# Patient Record
Sex: Male | Born: 1955
Health system: Southern US, Community
[De-identification: ages and names within clinical notes are randomized; demographics above are authoritative.]

## PROBLEM LIST (undated history)

## (undated) DIAGNOSIS — E785 Hyperlipidemia, unspecified: Secondary | ICD-10-CM

## (undated) DIAGNOSIS — S82899A Other fracture of unspecified lower leg, initial encounter for closed fracture: Secondary | ICD-10-CM

## (undated) DIAGNOSIS — S0291XA Unspecified fracture of skull, initial encounter for closed fracture: Secondary | ICD-10-CM

## (undated) HISTORY — PX: SHOULDER SURGERY: SHX246

---

## 2000-05-06 ENCOUNTER — Ambulatory Visit (HOSPITAL_COMMUNITY): Admission: RE | Admit: 2000-05-06 | Discharge: 2000-05-06 | Payer: Self-pay | Admitting: Orthopedic Surgery

## 2000-05-06 ENCOUNTER — Encounter: Payer: Self-pay | Admitting: Orthopedic Surgery

## 2000-08-25 ENCOUNTER — Ambulatory Visit (HOSPITAL_COMMUNITY): Admission: RE | Admit: 2000-08-25 | Discharge: 2000-08-25 | Payer: Self-pay | Admitting: Orthopedic Surgery

## 2002-04-25 ENCOUNTER — Emergency Department (HOSPITAL_COMMUNITY): Admission: EM | Admit: 2002-04-25 | Discharge: 2002-04-25 | Payer: Self-pay | Admitting: *Deleted

## 2012-03-19 ENCOUNTER — Ambulatory Visit
Admission: RE | Admit: 2012-03-19 | Discharge: 2012-03-19 | Disposition: A | Discharge status: Home / Self Care | Payer: 59 | Source: Ambulatory Visit | Attending: Family Medicine | Admitting: Family Medicine

## 2012-03-19 ENCOUNTER — Other Ambulatory Visit: Payer: Self-pay | Admitting: Family Medicine

## 2012-03-19 DIAGNOSIS — M79669 Pain in unspecified lower leg: Secondary | ICD-10-CM

## 2012-06-11 ENCOUNTER — Ambulatory Visit: Payer: 59 | Attending: Family Medicine | Admitting: Physical Therapy

## 2012-06-11 DIAGNOSIS — M2569 Stiffness of other specified joint, not elsewhere classified: Secondary | ICD-10-CM | POA: Insufficient documentation

## 2012-06-11 DIAGNOSIS — M542 Cervicalgia: Secondary | ICD-10-CM | POA: Insufficient documentation

## 2012-06-11 DIAGNOSIS — IMO0001 Reserved for inherently not codable concepts without codable children: Secondary | ICD-10-CM | POA: Insufficient documentation

## 2012-06-19 ENCOUNTER — Ambulatory Visit: Payer: 59 | Admitting: Physical Therapy

## 2012-06-22 ENCOUNTER — Ambulatory Visit: Payer: 59 | Admitting: Physical Therapy

## 2012-06-25 ENCOUNTER — Ambulatory Visit: Payer: 59 | Attending: Family Medicine | Admitting: Physical Therapy

## 2012-06-25 DIAGNOSIS — M542 Cervicalgia: Secondary | ICD-10-CM | POA: Insufficient documentation

## 2012-06-25 DIAGNOSIS — IMO0001 Reserved for inherently not codable concepts without codable children: Secondary | ICD-10-CM | POA: Insufficient documentation

## 2012-06-25 DIAGNOSIS — M2569 Stiffness of other specified joint, not elsewhere classified: Secondary | ICD-10-CM | POA: Insufficient documentation

## 2012-06-29 ENCOUNTER — Ambulatory Visit: Payer: 59 | Admitting: Physical Therapy

## 2012-07-02 ENCOUNTER — Ambulatory Visit: Payer: 59 | Admitting: Physical Therapy

## 2012-07-06 ENCOUNTER — Encounter: Payer: 59 | Admitting: Physical Therapy

## 2012-07-06 ENCOUNTER — Ambulatory Visit: Payer: 59 | Admitting: Physical Therapy

## 2012-07-09 ENCOUNTER — Ambulatory Visit: Payer: 59 | Admitting: Physical Therapy

## 2012-07-13 ENCOUNTER — Ambulatory Visit: Payer: 59

## 2012-07-16 ENCOUNTER — Ambulatory Visit: Payer: 59 | Admitting: Physical Therapy

## 2014-10-19 ENCOUNTER — Encounter (INDEPENDENT_AMBULATORY_CARE_PROVIDER_SITE_OTHER): Payer: 59 | Admitting: Ophthalmology

## 2014-10-19 DIAGNOSIS — H3531 Nonexudative age-related macular degeneration: Secondary | ICD-10-CM

## 2014-10-19 DIAGNOSIS — H43813 Vitreous degeneration, bilateral: Secondary | ICD-10-CM

## 2014-10-19 DIAGNOSIS — H33012 Retinal detachment with single break, left eye: Secondary | ICD-10-CM

## 2014-10-26 ENCOUNTER — Ambulatory Visit (INDEPENDENT_AMBULATORY_CARE_PROVIDER_SITE_OTHER): Payer: 59 | Admitting: Ophthalmology

## 2014-10-28 ENCOUNTER — Ambulatory Visit (INDEPENDENT_AMBULATORY_CARE_PROVIDER_SITE_OTHER): Payer: 59 | Admitting: Ophthalmology

## 2014-10-28 DIAGNOSIS — H33302 Unspecified retinal break, left eye: Secondary | ICD-10-CM

## 2015-02-27 ENCOUNTER — Ambulatory Visit (INDEPENDENT_AMBULATORY_CARE_PROVIDER_SITE_OTHER): Payer: 59 | Admitting: Ophthalmology

## 2015-02-27 DIAGNOSIS — H33302 Unspecified retinal break, left eye: Secondary | ICD-10-CM | POA: Diagnosis not present

## 2015-02-27 DIAGNOSIS — H43813 Vitreous degeneration, bilateral: Secondary | ICD-10-CM | POA: Diagnosis not present

## 2015-02-27 DIAGNOSIS — H3531 Nonexudative age-related macular degeneration: Secondary | ICD-10-CM

## 2015-02-27 DIAGNOSIS — H2513 Age-related nuclear cataract, bilateral: Secondary | ICD-10-CM

## 2015-05-11 ENCOUNTER — Other Ambulatory Visit: Payer: Self-pay | Admitting: Sports Medicine

## 2015-05-11 DIAGNOSIS — M545 Low back pain: Secondary | ICD-10-CM

## 2015-05-14 ENCOUNTER — Ambulatory Visit
Admission: RE | Admit: 2015-05-14 | Discharge: 2015-05-14 | Disposition: A | Discharge status: Home / Self Care | Payer: Self-pay | Source: Ambulatory Visit | Attending: Sports Medicine | Admitting: Sports Medicine

## 2015-05-14 DIAGNOSIS — M545 Low back pain: Secondary | ICD-10-CM

## 2016-09-26 DIAGNOSIS — J069 Acute upper respiratory infection, unspecified: Secondary | ICD-10-CM | POA: Diagnosis not present

## 2016-11-19 DIAGNOSIS — E785 Hyperlipidemia, unspecified: Secondary | ICD-10-CM | POA: Diagnosis not present

## 2016-11-21 DIAGNOSIS — E291 Testicular hypofunction: Secondary | ICD-10-CM | POA: Diagnosis not present

## 2017-01-07 DIAGNOSIS — H353130 Nonexudative age-related macular degeneration, bilateral, stage unspecified: Secondary | ICD-10-CM | POA: Diagnosis not present

## 2017-06-02 DIAGNOSIS — Z23 Encounter for immunization: Secondary | ICD-10-CM | POA: Diagnosis not present

## 2017-06-12 DIAGNOSIS — D225 Melanocytic nevi of trunk: Secondary | ICD-10-CM | POA: Diagnosis not present

## 2017-06-12 DIAGNOSIS — L570 Actinic keratosis: Secondary | ICD-10-CM | POA: Diagnosis not present

## 2017-06-12 DIAGNOSIS — L821 Other seborrheic keratosis: Secondary | ICD-10-CM | POA: Diagnosis not present

## 2017-06-12 DIAGNOSIS — L814 Other melanin hyperpigmentation: Secondary | ICD-10-CM | POA: Diagnosis not present

## 2017-07-04 DIAGNOSIS — Z0000 Encounter for general adult medical examination without abnormal findings: Secondary | ICD-10-CM | POA: Diagnosis not present

## 2017-07-15 DIAGNOSIS — Z0000 Encounter for general adult medical examination without abnormal findings: Secondary | ICD-10-CM | POA: Diagnosis not present

## 2017-07-15 DIAGNOSIS — E785 Hyperlipidemia, unspecified: Secondary | ICD-10-CM | POA: Diagnosis not present

## 2017-08-05 DIAGNOSIS — J069 Acute upper respiratory infection, unspecified: Secondary | ICD-10-CM | POA: Diagnosis not present

## 2017-10-08 DIAGNOSIS — H5213 Myopia, bilateral: Secondary | ICD-10-CM | POA: Diagnosis not present

## 2018-01-05 DIAGNOSIS — D225 Melanocytic nevi of trunk: Secondary | ICD-10-CM | POA: Diagnosis not present

## 2018-01-05 DIAGNOSIS — L718 Other rosacea: Secondary | ICD-10-CM | POA: Diagnosis not present

## 2018-01-05 DIAGNOSIS — L814 Other melanin hyperpigmentation: Secondary | ICD-10-CM | POA: Diagnosis not present

## 2018-01-05 DIAGNOSIS — L820 Inflamed seborrheic keratosis: Secondary | ICD-10-CM | POA: Diagnosis not present

## 2018-01-05 DIAGNOSIS — L570 Actinic keratosis: Secondary | ICD-10-CM | POA: Diagnosis not present

## 2018-06-16 DIAGNOSIS — M25562 Pain in left knee: Secondary | ICD-10-CM | POA: Diagnosis not present

## 2018-06-18 DIAGNOSIS — M25562 Pain in left knee: Secondary | ICD-10-CM | POA: Diagnosis not present

## 2018-06-24 DIAGNOSIS — M25562 Pain in left knee: Secondary | ICD-10-CM | POA: Diagnosis not present

## 2018-06-25 DIAGNOSIS — M25562 Pain in left knee: Secondary | ICD-10-CM | POA: Diagnosis not present

## 2018-06-29 DIAGNOSIS — M25562 Pain in left knee: Secondary | ICD-10-CM | POA: Diagnosis not present

## 2018-06-29 DIAGNOSIS — S83412D Sprain of medial collateral ligament of left knee, subsequent encounter: Secondary | ICD-10-CM | POA: Diagnosis not present

## 2018-06-29 DIAGNOSIS — M6281 Muscle weakness (generalized): Secondary | ICD-10-CM | POA: Diagnosis not present

## 2018-07-06 DIAGNOSIS — M6281 Muscle weakness (generalized): Secondary | ICD-10-CM | POA: Diagnosis not present

## 2018-07-06 DIAGNOSIS — M25562 Pain in left knee: Secondary | ICD-10-CM | POA: Diagnosis not present

## 2018-07-06 DIAGNOSIS — S83412D Sprain of medial collateral ligament of left knee, subsequent encounter: Secondary | ICD-10-CM | POA: Diagnosis not present

## 2018-07-07 DIAGNOSIS — E785 Hyperlipidemia, unspecified: Secondary | ICD-10-CM | POA: Diagnosis not present

## 2018-07-07 DIAGNOSIS — Z125 Encounter for screening for malignant neoplasm of prostate: Secondary | ICD-10-CM | POA: Diagnosis not present

## 2018-07-07 DIAGNOSIS — Z0000 Encounter for general adult medical examination without abnormal findings: Secondary | ICD-10-CM | POA: Diagnosis not present

## 2018-07-13 DIAGNOSIS — M25562 Pain in left knee: Secondary | ICD-10-CM | POA: Diagnosis not present

## 2018-07-13 DIAGNOSIS — S83412D Sprain of medial collateral ligament of left knee, subsequent encounter: Secondary | ICD-10-CM | POA: Diagnosis not present

## 2018-07-13 DIAGNOSIS — M6281 Muscle weakness (generalized): Secondary | ICD-10-CM | POA: Diagnosis not present

## 2018-07-23 DIAGNOSIS — M25562 Pain in left knee: Secondary | ICD-10-CM | POA: Diagnosis not present

## 2018-07-28 ENCOUNTER — Ambulatory Visit (INDEPENDENT_AMBULATORY_CARE_PROVIDER_SITE_OTHER): Payer: 59 | Admitting: Psychiatry

## 2018-07-28 DIAGNOSIS — F411 Generalized anxiety disorder: Secondary | ICD-10-CM | POA: Diagnosis not present

## 2018-07-28 NOTE — Progress Notes (Signed)
      [facility] Counselor/Therapist Progress Note   Patient IDLavontay Paul, MRN: [id number]  Date: 07/28/2018  Timespent: 52 minutes   Treatment Type: Individual   Reported Symptoms: anxiety   Mental Status Exam:    Appearance:   Casual     Behavior:  Appropriate  Motor:  Normal  Speech/Language:   Clear and Coherent  Affect:  Appropriate  Mood:  anxious  Thought process:  normal  Thought content:    WNL  Sensory/Perceptual disturbances:    WNL  Orientation:  oriented to person, place, time/date and situation  Attention:  Good  Concentration:  Good  Memory:  WNL  Fund of knowledge:   Good  Insight:    Good  Judgment:   Good  Impulse Control:  Good     Risk Assessment: Danger to Self:  No Self-injurious Behavior: No Danger to Others: No Duty to Warn:no Physical Aggression / Violence:No  Access to Firearms a concern: No  Gang Involvement:No    Subjective: The client reports that he and his girlfriend have been meeting with a marriage counselor,[place], [place].  He realizes that their main issues has to do with her son.  She does not trust him because he asks his prayer group for prayers about her son.  She is very private and it upsets her that others Shawan Corella know her business.  She is very clear with him that she will not marry him or cohabitate with him.  This puzzles the client because he feels they have a very dynamic relationship.  The girlfriend is very focused on her relationship with her son who is 5 years old and in his last year of college. The clients own children are doing well.  He has started moving towards some of the goals he had set at last session.  He is working on his resume but he has yet to contact a Field seismologist.  His current job seems to be stable but that could change at any moment.  The client continues to exercise and maintain his friendships.  He notes his overall anxiety is a little bit less.  On a subjective units of distress scale it is at  a 4.  Interventions: Assertiveness/Communication, Solution-Oriented/Positive Psychology and Insight-Oriented   Diagnosis:   ICD-10-CM   1. Generalized anxiety disorder F41.1      Plan: Boundaries, assertiveness, resume.   Andrew Paul Andrew Paul, [place]

## 2018-08-28 ENCOUNTER — Ambulatory Visit (INDEPENDENT_AMBULATORY_CARE_PROVIDER_SITE_OTHER): Payer: 59 | Admitting: Psychiatry

## 2018-08-28 ENCOUNTER — Encounter: Payer: Self-pay | Admitting: Psychiatry

## 2018-08-28 DIAGNOSIS — F411 Generalized anxiety disorder: Secondary | ICD-10-CM | POA: Diagnosis not present

## 2018-08-28 NOTE — Progress Notes (Signed)
      [facility] Counselor/Therapist Progress Note  Patient IDMina Paul, MRN: [id number],    Date: 08/28/2018   Time Spent: 45 minutes   Treatment Type: Individual Therapy  Reported Symptoms: Anxious Mood  Mental Status Exam:  Appearance:   Well Groomed     Behavior:  Appropriate  Motor:  Normal  Speech/Language:   Clear and Coherent  Affect:  Appropriate  Mood:  anxious  Thought process:  normal  Thought content:    WNL  Sensory/Perceptual disturbances:    WNL  Orientation:  oriented to person, place, time/date and situation  Attention:  Good  Concentration:  Good  Memory:  WNL  Fund of knowledge:   Good  Insight:    Good  Judgment:   Good  Impulse Control:  Good   Risk Assessment: Danger to Self:  No Self-injurious Behavior: No Danger to Others: No Duty to Warn:no Physical Aggression / Violence:No  Access to Firearms a concern: No  Gang Involvement:No   Subjective: The client reports that he is just returned from a national meeting.  The higher ups in the company had complained because sales this past year had dropped.  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  Sales dropped dramatically.  He was then told because that his compensation package had over paid him $7000.  They expected him to pay that back.  This flabbergasted the client.  He talked with his direct supervisor who was open to listening to him.  His supervisor then went to the president of the company to get his job re-leveled and a reprieve from the $7000.  His supervisor came back and told him that his job would become a salary position and that he was not getting find the $7000.  The client was ultimately relieved because he was not getting fired.  The client is 62 years old and hopes to make it to 29 so he can retire.  The client was very anxious as he came in more charged up on adrenaline because of what had  just happened.  We discussed the fact that the client still needs to get his resume together because of the instability of his current job.  He agreed.  His knee has recovered from an injury and he is back to playing soccer.  We discussed the fact that he needed to increase his exercise to help reduce his overall stress level.  He agreed.  The client has also been able to do more positive self talk.  He will continue to do so.  Interventions: Assertiveness/Communication, Solution-Oriented/Positive Psychology and Insight-Oriented  Diagnosis:   ICD-10-CM   1. Generalized anxiety disorder F41.1     Plan: Exercise, resume, positive self talk.  Albertina Parr [place], [place]

## 2018-09-02 DIAGNOSIS — Z23 Encounter for immunization: Secondary | ICD-10-CM | POA: Diagnosis not present

## 2018-09-10 DIAGNOSIS — M25562 Pain in left knee: Secondary | ICD-10-CM | POA: Diagnosis not present

## 2018-09-25 ENCOUNTER — Other Ambulatory Visit: Payer: Self-pay

## 2018-09-25 ENCOUNTER — Encounter (HOSPITAL_COMMUNITY): Payer: Self-pay | Admitting: Emergency Medicine

## 2018-09-25 ENCOUNTER — Emergency Department (HOSPITAL_COMMUNITY): Payer: 59

## 2018-09-25 ENCOUNTER — Ambulatory Visit (HOSPITAL_COMMUNITY)
Admission: EM | Admit: 2018-09-25 | Discharge: 2018-09-25 | Disposition: A | Discharge status: Home / Self Care | Payer: 59

## 2018-09-25 ENCOUNTER — Emergency Department (HOSPITAL_COMMUNITY)
Admission: EM | Admit: 2018-09-25 | Discharge: 2018-09-25 | Disposition: A | Discharge status: Home / Self Care | Payer: 59 | Attending: Emergency Medicine | Admitting: Emergency Medicine

## 2018-09-25 DIAGNOSIS — Z5321 Procedure and treatment not carried out due to patient leaving prior to being seen by health care provider: Secondary | ICD-10-CM | POA: Insufficient documentation

## 2018-09-25 DIAGNOSIS — R079 Chest pain, unspecified: Secondary | ICD-10-CM

## 2018-09-25 HISTORY — DX: Hyperlipidemia, unspecified: E78.5

## 2018-09-25 HISTORY — DX: Other fracture of unspecified lower leg, initial encounter for closed fracture: S82.899A

## 2018-09-25 HISTORY — DX: Unspecified fracture of skull, initial encounter for closed fracture: S02.91XA

## 2018-09-25 LAB — BASIC METABOLIC PANEL
Anion gap: 7 (ref 5–15)
BUN: 14 mg/dL (ref 8–23)
CO2: 23 mmol/L (ref 22–32)
CREATININE: 1.06 mg/dL (ref 0.61–1.24)
Calcium: 8.9 mg/dL (ref 8.9–10.3)
Chloride: 108 mmol/L (ref 98–111)
GFR calc Af Amer: >60 mL/min (ref >60)
GFR calc non Af Amer: >60 mL/min (ref >60)
Glucose, Bld: 104 mg/dL — ABNORMAL HIGH (ref 70–99)
Potassium: 3.7 mmol/L (ref 3.5–5.1)
Sodium: 138 mmol/L (ref 135–145)

## 2018-09-25 LAB — I-STAT TROPONIN, ED: Troponin i, poc: 0.01 ng/mL (ref 0.00–0.08)

## 2018-09-25 LAB — CBC
HCT: 40.6 % (ref 39.0–52.0)
Hemoglobin: 14.7 g/dL (ref 13.0–17.0)
MCH: 31.7 pg (ref 26.0–34.0)
MCHC: 36.2 g/dL — ABNORMAL HIGH (ref 30.0–36.0)
MCV: 87.7 fL (ref 80.0–100.0)
Platelets: 193 10*3/uL (ref 150–400)
RBC: 4.63 MIL/uL (ref 4.22–5.81)
RDW: 12.5 % (ref 11.5–15.5)
WBC: 4.7 10*3/uL (ref 4.0–10.5)
nRBC: 0 % (ref 0.0–0.2)

## 2018-09-25 NOTE — ED Notes (Signed)
Pt here for off and on chest pain, ekg obtain, denies chest pain at this time. EKG given to Dr. Meda Coffee, pt needs eval in ER. Pt and wife agreeable to plan. Left for ER.

## 2018-09-25 NOTE — ED Triage Notes (Signed)
C/o intermittent pain (feels like "needles") to upper left chest since last night.  Reports a lot of stress at work.  Denies nausea, vomiting, and SOB.  No pain at present.

## 2018-09-25 NOTE — ED Notes (Signed)
Pt is wanting to leave. Pt encouraged to stay and see provider. Pt refuses and turns in labels. Pt seen walkign out of lobby with family.

## 2018-09-30 ENCOUNTER — Ambulatory Visit (INDEPENDENT_AMBULATORY_CARE_PROVIDER_SITE_OTHER): Payer: 59 | Admitting: Psychiatry

## 2018-09-30 ENCOUNTER — Encounter: Payer: Self-pay | Admitting: Psychiatry

## 2018-09-30 DIAGNOSIS — F411 Generalized anxiety disorder: Secondary | ICD-10-CM

## 2018-09-30 NOTE — Progress Notes (Signed)
      [facility] Counselor/Therapist Progress Note  Patient IDDale Paul, MRN: [id number],    Date: 09/30/2018  Time Spent: 30 minutes   Treatment Type: Individual Therapy  Reported Symptoms: Anxious Mood  Mental Status Exam:  Appearance:   Casual and Well Groomed     Behavior:  Appropriate  Motor:  Normal  Speech/Language:   Clear and Coherent  Affect:  Appropriate  Mood:  anxious  Thought process:  normal  Thought content:    WNL  Sensory/Perceptual disturbances:    WNL  Orientation:  oriented to person, place, time/date and situation  Attention:  Good  Concentration:  Good  Memory:  WNL  Fund of knowledge:   Good  Insight:    Good  Judgment:   Good  Impulse Control:  Good   Risk Assessment: Danger to Self:  No Self-injurious Behavior: No Danger to Others: No Duty to Warn:no Physical Aggression / Violence:No  Access to Firearms a concern: No  Gang Involvement:No   Subjective: The client was late for his appointment due to a delay at his job.  He states right before Christmas he and his girlfriend had followed up with their relationship counselor.  She told him in no uncertain terms and that session #1 she does not want to get married, #2 she does not want to live together.  The client was very hurt by this and they had a lapse of contact for 10 days.  She has come back stating that she knows she has issues.  The client is somewhat relieved with this but will continue to move forward cautiously.  He will be assertive and set appropriate boundaries.  Interventions: Solution-Oriented/Positive Psychology and Insight-Oriented  Diagnosis:   ICD-10-CM   1. Generalized anxiety disorder F41.1     Plan: Boundaries, assertiveness, self care.  [name], [place]

## 2018-10-21 ENCOUNTER — Encounter: Payer: Self-pay | Admitting: Psychiatry

## 2018-10-21 ENCOUNTER — Ambulatory Visit (INDEPENDENT_AMBULATORY_CARE_PROVIDER_SITE_OTHER): Payer: 59 | Admitting: Psychiatry

## 2018-10-21 DIAGNOSIS — F411 Generalized anxiety disorder: Secondary | ICD-10-CM

## 2018-10-21 NOTE — Progress Notes (Signed)
      [facility] Counselor/Therapist Progress Note  Patient IDJermal Paul, MRN: [id number],    Date: 10/21/2018  Time Spent: 48 minutes   Treatment Type: Individual Therapy  Reported Symptoms: Anxious Mood  Mental Status Exam:  Appearance:   Casual and Well Groomed     Behavior:  Appropriate  Motor:  Normal  Speech/Language:   Clear and Coherent  Affect:  Appropriate  Mood:  anxious  Thought process:  normal  Thought content:    WNL  Sensory/Perceptual disturbances:    WNL  Orientation:  oriented to person, place, time/date and situation  Attention:  Good  Concentration:  Good  Memory:  WNL  Fund of knowledge:   Good  Insight:    Good  Judgment:   Good  Impulse Control:  Good   Risk Assessment: Danger to Self:  No Self-injurious Behavior: No Danger to Others: No Duty to Warn:no Physical Aggression / Violence:No  Access to Firearms a concern: No  Gang Involvement:No   Subjective: The client states that he has been finishing projects at his home as we had discussed last time.  This includes painting, remodeling his bathroom and finishing his kitchen.  Today he is having his front door replaced which she is very excited about.  He also is working on getting the house thoroughly cleaned as this has been 1 of the big issues that his girlfriend has had with him. His job is still transitioning.  He has been really leveled in his position which is actually been a promotion.  He does note with the changes in the bony structure that he is going to have an $800 a month deficit in his cash flow.  This concerns him.  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  He has decided to throw his hat in the reading and applying for the director position.  It will be more of a promotion and paying more money. All of this generates uncertainty with the client.  Today we used E MDR to reduce the clients subjective units  of distress from a 5+ to less than 1 at the end of the session.  He sees that he just needs to continue to put 1 foot in front of the other.  Interventions: Assertiveness/Communication, Solution-Oriented/Positive Psychology, Eye Movement Desensitization and Reprocessing (EMDR) and Insight-Oriented  Diagnosis:   ICD-10-CM   1. Generalized anxiety disorder F41.1     Plan: Assertiveness, boundaries, self-care.  Albertina Parr Toriano [place], [place]

## 2018-11-24 ENCOUNTER — Encounter: Payer: Self-pay | Admitting: Psychiatry

## 2018-11-24 ENCOUNTER — Ambulatory Visit (INDEPENDENT_AMBULATORY_CARE_PROVIDER_SITE_OTHER): Payer: 59 | Admitting: Psychiatry

## 2018-11-24 DIAGNOSIS — F411 Generalized anxiety disorder: Secondary | ICD-10-CM

## 2018-11-24 NOTE — Progress Notes (Signed)
      [facility] Counselor/Therapist Progress Note  Patient IDLaurel Paul, MRN: [id number],    Date: 11/24/2018  Time Spent: 51 minutes   Treatment Type: Individual Therapy  Reported Symptoms: anxiety  Mental Status Exam:  Appearance:   Casual and Well Groomed     Behavior:  Appropriate  Motor:  Normal  Speech/Language:   Clear and Coherent  Affect:  Appropriate  Mood:  anxious  Thought process:  normal  Thought content:    WNL  Sensory/Perceptual disturbances:    WNL  Orientation:  oriented to person, place, time/date and situation  Attention:  Good  Concentration:  Good  Memory:  WNL  Fund of knowledge:   Good  Insight:    Good  Judgment:   Good  Impulse Control:  Good   Risk Assessment: Danger to Self:  No Self-injurious Behavior: No Danger to Others: No Duty to Warn:no Physical Aggression / Violence:No  Access to Firearms a concern: No  Gang Involvement:No   Subjective: The client reports that his girlfriend and son got a great job in [place], [place].  He will be moving there in June.  This is a good step forward for his relationship with his girlfriend.  Her concern about them getting married was that her son was not successfully launched.  With this job in [place] that problem is solved.  The client also reports that his projects at his house are going well.  He is completing the tasks that his girlfriend has laid out as necessary for them to be together long-term.  His current job has been restructured in a way that makes sense to him.  He has the opportunity at the end of the year based on sales to make a large bonus.  All of this has made the client much less anxious.  He continues to exercise and work on his self-care.  He also is evaluating his negative thoughts connected to his anxiety as they come up.  Interventions: Assertiveness/Communication, Solution-Oriented/Positive Psychology and Insight-Oriented  Diagnosis:   ICD-10-CM   1. Generalized anxiety  disorder F41.1     Plan: Exercise, self-care, positive self talk.  This record has been created using [employer].  Chart creation errors have been sought, but Andrew Paul not always have been located and corrected. Such creation errors do not reflect on the standard of medical care.   Andrew Paul, [place]

## 2018-12-30 ENCOUNTER — Ambulatory Visit: Payer: 59 | Admitting: Psychiatry

## 2019-01-25 ENCOUNTER — Ambulatory Visit: Payer: 59 | Admitting: Psychiatry

## 2019-03-02 ENCOUNTER — Ambulatory Visit: Payer: 59 | Admitting: Psychiatry

## 2019-09-25 IMAGING — CR DG CHEST 2V
2 series · 2 of 2 positions shown · non-contrast
Comparison: None.

CLINICAL DATA: Anxiety.  Anterior chest pain.

EXAM:
CHEST - 2 VIEW

[chest pa]
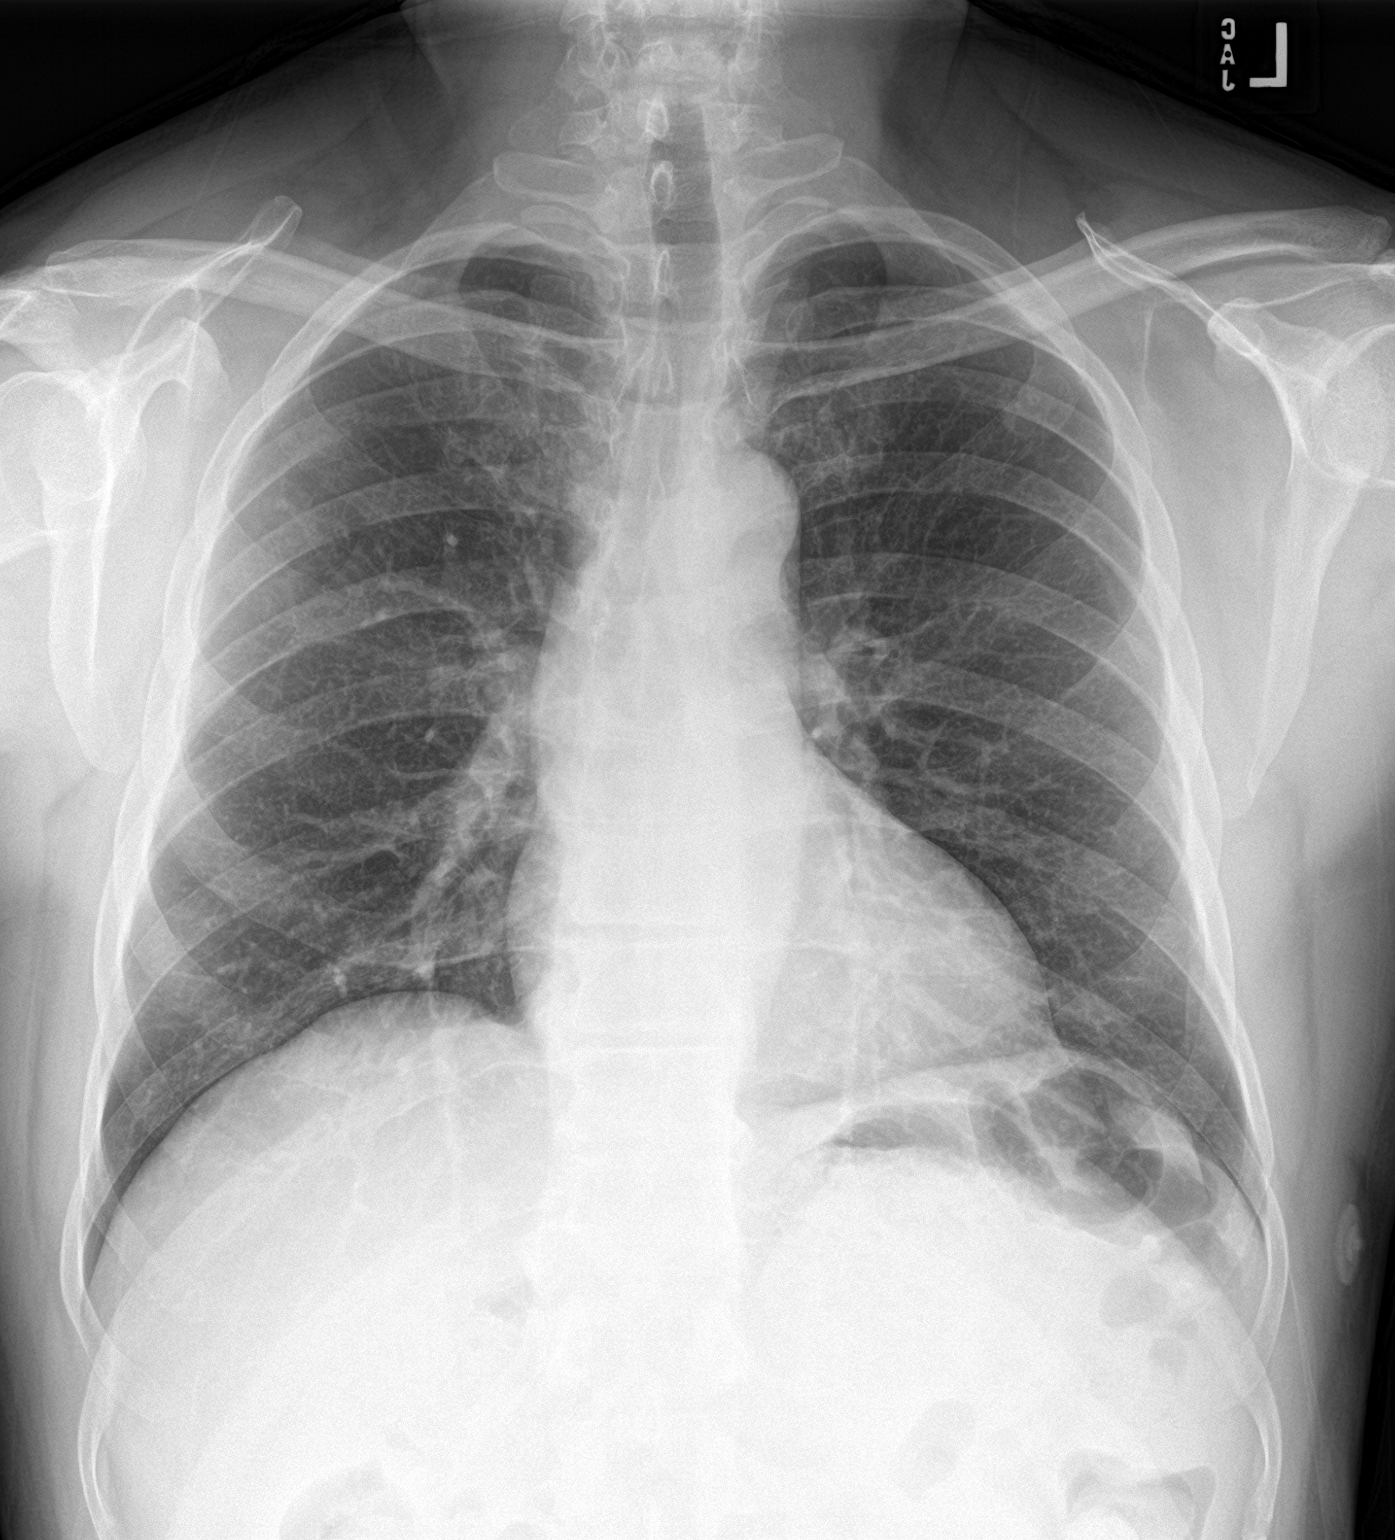

[chest lat]
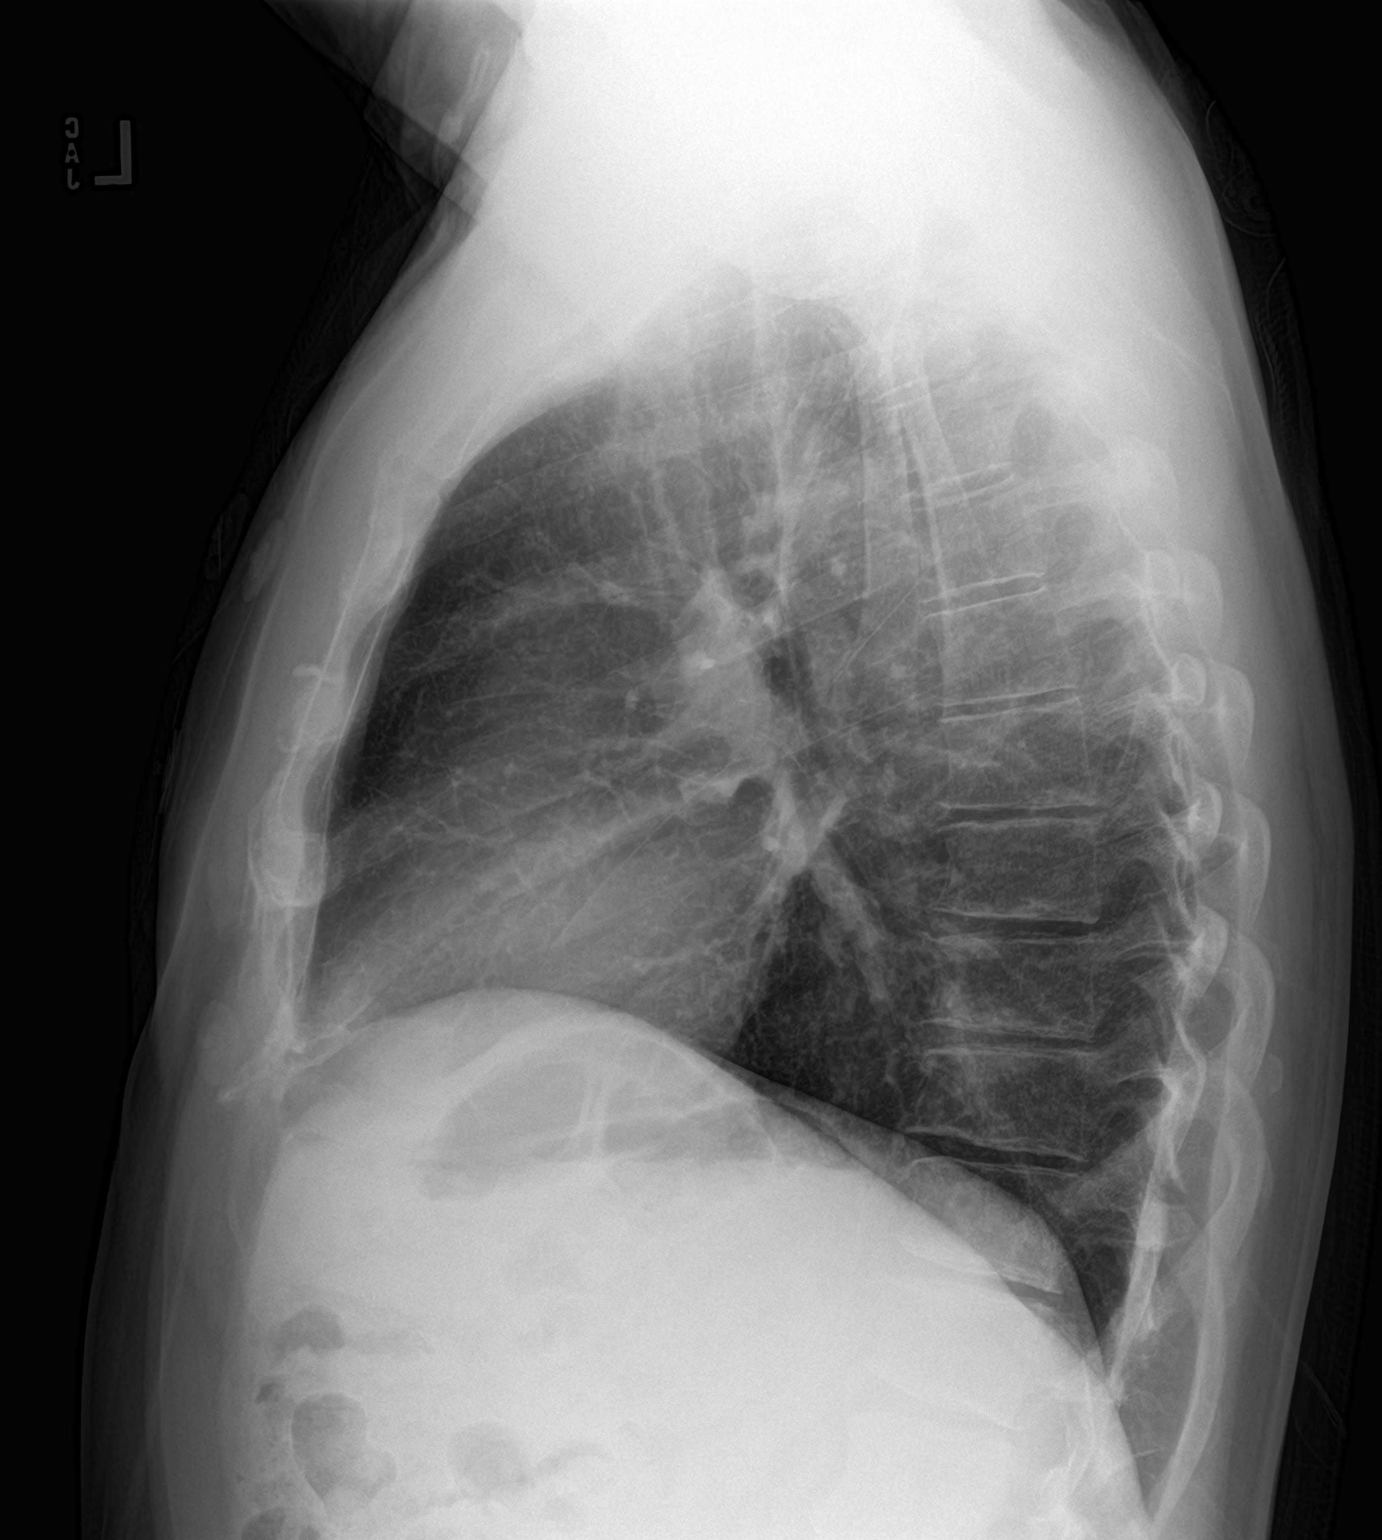

[2 of 2 positions shown; findings below may reference images not displayed]

FINDINGS: Cardiac silhouette is normal in size. Normal mediastinal and hilar
contours.

Clear lungs.  No pleural effusion or pneumothorax.

Skeletal structures are intact.
IMPRESSION: No active cardiopulmonary disease.

## 2020-08-09 ENCOUNTER — Ambulatory Visit (INDEPENDENT_AMBULATORY_CARE_PROVIDER_SITE_OTHER): Payer: 59 | Admitting: Psychiatry

## 2020-08-09 ENCOUNTER — Other Ambulatory Visit: Payer: Self-pay

## 2020-08-09 ENCOUNTER — Encounter: Payer: Self-pay | Admitting: Psychiatry

## 2020-08-09 DIAGNOSIS — F411 Generalized anxiety disorder: Secondary | ICD-10-CM | POA: Diagnosis not present

## 2020-08-09 NOTE — Progress Notes (Signed)
      [facility] Counselor/Therapist Progress Note  Patient IDAbishai Paul, MRN: [id number],    Date: 08/09/2020  Time Spent: 50 minutes   Treatment Type: Individual Therapy  Reported Symptoms: anxiety  Mental Status Exam:  Appearance:   Casual     Behavior:  Appropriate  Motor:  Normal  Speech/Language:   Clear and Coherent  Affect:  Appropriate  Mood:  anxious  Thought process:  normal  Thought content:    WNL  Sensory/Perceptual disturbances:    WNL  Orientation:  oriented to person, place, time/date and situation  Attention:  Good  Concentration:  Good  Memory:  WNL  Fund of knowledge:   Good  Insight:    Good  Judgment:   Good  Impulse Control:  Good   Risk Assessment: Danger to Self:  No Self-injurious Behavior: No Danger to Others: No Duty to Warn:no Physical Aggression / Violence:No  Access to Firearms a concern: No  Gang Involvement:No   Subjective: The client states that he recently broke up with his longtime girlfriend.  She lives in [place] and works as a Immunologist at [employer].  He has realized more and more that she has covert malignant narcissism.  The client states he has been watching YouTube videos on the subject.  He states it has been very instructive for him concerning the symptoms of narcissism.  In addition to being narcissistic she also has an obsessive-compulsive disorder.  He states she controls everything about the relationship.  He comes over 2 days a week but he cannot keep any of his stuff there.  Recently she decided they could no longer kiss because of the fear of COVID.  The client thought that was a little odd considering they continued to have sex.  As the client discussed this more and more his hope was that there could be a way he could communicate to her that she had these problems. I explained to the client that narcissists typically do not come into my office because they are not the problem.  It is usually everyone else.  They also  lack insight and empathy which are major problems in relationships.  The client kept trying to explain that if she could just understand she could change her behavior.  I told the client if he needed to write a letter to her he could but not send it.  To continue to try and engage her will just lead to more difficulties.  Ultimately the client decided that he needed to block her on all his media and let it go.  The client's first wife had a borderline personality disorder.  I suggested to the client that he be careful when he begins to date again and pay attention to potential red flags.  He agreed.  Interventions: Assertiveness/Communication, Motivational Interviewing, Solution-Oriented/Positive Psychology and Insight-Oriented  Diagnosis:   ICD-10-CM   1. Generalized anxiety disorder  F41.1     Plan: Mood independent behavior, positive self talk, self-care, block ex-girlfriend and all social media, write letter to girlfriend he does not send.  Shital Crayton, [facility]

## 2020-09-01 ENCOUNTER — Ambulatory Visit (INDEPENDENT_AMBULATORY_CARE_PROVIDER_SITE_OTHER): Payer: 59 | Admitting: Psychiatry

## 2020-09-01 ENCOUNTER — Other Ambulatory Visit: Payer: Self-pay

## 2020-09-01 ENCOUNTER — Encounter: Payer: Self-pay | Admitting: Psychiatry

## 2020-09-01 DIAGNOSIS — F411 Generalized anxiety disorder: Secondary | ICD-10-CM | POA: Diagnosis not present

## 2020-09-01 NOTE — Progress Notes (Signed)
      [facility] Counselor/Therapist Progress Note  Patient IDHarkirat Paul, MRN: [id number],    Date: 09/01/2020  Time Spent: 50 minutes   Treatment Type: Individual Therapy  Reported Symptoms: anxiety  Mental Status Exam:  Appearance:   Casual     Behavior:  Appropriate  Motor:  Normal  Speech/Language:   Clear and Coherent  Affect:  Appropriate  Mood:  anxious  Thought process:  normal  Thought content:    WNL  Sensory/Perceptual disturbances:    WNL  Orientation:  oriented to person, place, time/date and situation  Attention:  Good  Concentration:  Good  Memory:  WNL  Fund of knowledge:   Good  Insight:    Good  Judgment:   Good  Impulse Control:  Good   Risk Assessment: Danger to Self:  No Self-injurious Behavior: No Danger to Others: No Duty to Warn:no Physical Aggression / Violence:No  Access to Firearms a concern: No  Gang Involvement:No   Subjective: "I cut off communication with my ex-girlfriend.  She ended up contacting my son who set an appropriate boundary.  He then blocked her as well."  The client states that he has done better.  He has been trying to stay busy.  It is hardest when he is alone.  I suggested to the client that he write his ex-girlfriend a letter that he does not send as a way to help him to continue to process his thoughts and feelings.  He Marquette Piontek consider this. He stated that he met with his stepmother who has the power of attorney for her father in [place].  She told him out right she did not like him and did not want him to be involved in any of his father's properties.  She also knows that the client is friends with her son and expressly told him to discontinue that relationship.  I pointed out to the client that it appears his father married someone very narcissistic as well.  The client realized this was true.  He will not get into a power struggle with his stepmother and let things play out as they will. The client discussed future  relationships and how he wants to handle them.  I suggested to the client that he try to develop emotional intimacy first before defaulting to physical intimacy.  This gives a much better outcome since physical intimacy tends to tie hearts together.  If the relationship is not going to work it makes it easier to end it.  The client agreed and stated he would try to be intentional about this since it dovetails with his Darrick Meigs beliefs.   Interventions: Assertiveness/Communication, Motivational Interviewing, Solution-Oriented/Positive Psychology and Insight-Oriented  Diagnosis:   ICD-10-CM   1. Generalized anxiety disorder  F41.1     Plan: Mood independent behavior, positive self talk, self-care, boundaries, assertiveness, avoid power struggles, letter to girlfriend that the client does not send.  Valory Wetherby, [facility]

## 2020-09-12 ENCOUNTER — Encounter: Payer: Self-pay | Admitting: Psychiatry

## 2020-09-12 ENCOUNTER — Other Ambulatory Visit: Payer: Self-pay

## 2020-09-12 ENCOUNTER — Ambulatory Visit (INDEPENDENT_AMBULATORY_CARE_PROVIDER_SITE_OTHER): Payer: 59 | Admitting: Psychiatry

## 2020-09-12 DIAGNOSIS — F411 Generalized anxiety disorder: Secondary | ICD-10-CM

## 2020-09-12 NOTE — Progress Notes (Signed)
°      [facility] Counselor/Therapist Progress Note  Patient IDRachel Paul, MRN: [id number],    Date: 09/12/2020  Time Spent: 30 minutes   Treatment Type: Individual Therapy  Reported Symptoms: anxiety  Mental Status Exam:  Appearance:   Casual     Behavior:  Appropriate  Motor:  Normal  Speech/Language:   Clear and Coherent  Affect:  Appropriate  Mood:  anxious  Thought process:  normal  Thought content:    WNL  Sensory/Perceptual disturbances:    WNL  Orientation:  oriented to person, place, time/date and situation  Attention:  Good  Concentration:  Good  Memory:  WNL  Fund of knowledge:   Good  Insight:    Good  Judgment:   Good  Impulse Control:  Good   Risk Assessment: Danger to Self:  No Self-injurious Behavior: No Danger to Others: No Duty to Warn:no Physical Aggression / Violence:No  Access to Firearms a concern: No  Gang Involvement:No   Subjective: The client states he has had some ups and downs which has caused him anxiety.  He has been missing his ex-girlfriend.  "I have been on a few dates."  He found her profile on the same website he was using which bothered him.  He thinks about the pros and cons of his ex-girlfriend but when it finally comes down to it he feels the narcissistic traits make it a no go for him. The client has worked at managing his mood with exercise.  I discussed controlling his thought process today being mindful and using thought stopping.  He is also focusing on radical acceptance that the circumstances are as they are.  He is trying to live more intentionally.  Currently he will work on his social network.  He plans on attending some [place] groups especially those around hiking.  The client had to leave the session early to make a telephone conference.  Interventions: Assertiveness/Communication, Motivational Interviewing, Solution-Oriented/Positive Psychology and Insight-Oriented  Diagnosis:   ICD-10-CM   1. Generalized anxiety  disorder  F41.1     Plan: Independent behavior, exercise, intentionality, radical acceptance, assertiveness, boundaries, thought stopping.  Emilyanne Mcgough, [facility]

## 2020-10-24 ENCOUNTER — Ambulatory Visit: Payer: 59 | Admitting: Psychiatry

## 2020-11-14 ENCOUNTER — Ambulatory Visit: Payer: 59 | Admitting: Psychiatry

## 2020-12-05 ENCOUNTER — Other Ambulatory Visit: Payer: Self-pay

## 2020-12-05 ENCOUNTER — Ambulatory Visit (INDEPENDENT_AMBULATORY_CARE_PROVIDER_SITE_OTHER): Payer: 59 | Admitting: Psychiatry

## 2020-12-05 ENCOUNTER — Encounter: Payer: Self-pay | Admitting: Psychiatry

## 2020-12-05 DIAGNOSIS — F411 Generalized anxiety disorder: Secondary | ICD-10-CM

## 2020-12-05 NOTE — Progress Notes (Signed)
      [facility] Counselor/Therapist Progress Note  Patient IDEmrik Paul, MRN: [id number],    Date: 12/05/2020  Time Spent: 45 minutes   Treatment Type: Individual Therapy  Reported Symptoms: anxiety  Mental Status Exam:  Appearance:   Casual and Well Groomed     Behavior:  Appropriate  Motor:  Normal  Speech/Language:   Clear and Coherent  Affect:  Appropriate  Mood:  anxious  Thought process:  normal  Thought content:    WNL  Sensory/Perceptual disturbances:    WNL  Orientation:  oriented to person, place, time/date and situation  Attention:  Good  Concentration:  Good  Memory:  WNL  Fund of knowledge:   Good  Insight:    Good  Judgment:   Good  Impulse Control:  Good   Risk Assessment: Danger to Self:  No Self-injurious Behavior: No Danger to Others: No Duty to Warn:no Physical Aggression / Violence:No  Access to Firearms a concern: No  Gang Involvement:No   Subjective: The client states that he has not seen his ex-girlfriend since they broke up 4 months ago.  As the client reviewed his relationship with her he stated, "I do not think I could have done any better than I did."  As we discussed his relationship in detail the client noted that because of his girlfriend's narcissistic behavior she really was not equipped to be emotionally reciprocal.  I agreed that she just did not have the necessary skill set.  The client states that he continues to process it but he notes he is not as sad.  I explained to the client that it can take people up to 5 years to process the loss of a person or a relationship. The client states that he has done some dating but it has not gone well.  He has had some coffee dates but not really connected to anyone.  We discussed exploring the [place] groups where people are doing activities.  It would be a better circumstance to meet someone that has a similar interest.  The client agreed.  I also suggested that he look into playing pickle ball  which seems to be very popular right now.  We also discussed the new church the client is attending.  Looking into joining one of the Sunday school groups might be a way of the meeting other single women.  The client agreed.  Overall he feels he is doing fairly well except for the occasional anxiety.  Interventions: Assertiveness/Communication, Motivational Interviewing, Solution-Oriented/Positive Psychology, [employer] Desensitization and Reprocessing (EMDR) and Insight-Oriented  Diagnosis:   ICD-10-CM   1. Generalized anxiety disorder  F41.1     Plan: Positive self talk, self-care, explore other activity groups such as meet up, joining a pickleball group, continue with church, exercise, assertiveness, boundaries.  Genesi Stefanko, [facility]

## 2020-12-26 ENCOUNTER — Ambulatory Visit: Payer: 59 | Admitting: Psychiatry

## 2021-01-16 ENCOUNTER — Ambulatory Visit: Payer: 59 | Admitting: Psychiatry

## 2021-02-13 ENCOUNTER — Ambulatory Visit: Payer: 59 | Admitting: Psychiatry

## 2021-11-19 DIAGNOSIS — E7800 Pure hypercholesterolemia, unspecified: Secondary | ICD-10-CM | POA: Diagnosis not present

## 2021-11-19 DIAGNOSIS — Z0000 Encounter for general adult medical examination without abnormal findings: Secondary | ICD-10-CM | POA: Diagnosis not present

## 2021-11-19 DIAGNOSIS — Z1211 Encounter for screening for malignant neoplasm of colon: Secondary | ICD-10-CM | POA: Diagnosis not present

## 2021-12-22 DIAGNOSIS — Z7184 Encounter for health counseling related to travel: Secondary | ICD-10-CM | POA: Diagnosis not present

## 2021-12-26 DIAGNOSIS — D123 Benign neoplasm of transverse colon: Secondary | ICD-10-CM | POA: Diagnosis not present

## 2021-12-26 DIAGNOSIS — Z8371 Family history of colonic polyps: Secondary | ICD-10-CM | POA: Diagnosis not present

## 2021-12-26 DIAGNOSIS — D122 Benign neoplasm of ascending colon: Secondary | ICD-10-CM | POA: Diagnosis not present

## 2021-12-26 DIAGNOSIS — Z86010 Personal history of colonic polyps: Secondary | ICD-10-CM | POA: Diagnosis not present

## 2022-01-01 DIAGNOSIS — D123 Benign neoplasm of transverse colon: Secondary | ICD-10-CM | POA: Diagnosis not present

## 2022-04-08 DIAGNOSIS — L538 Other specified erythematous conditions: Secondary | ICD-10-CM | POA: Diagnosis not present

## 2022-04-08 DIAGNOSIS — L820 Inflamed seborrheic keratosis: Secondary | ICD-10-CM | POA: Diagnosis not present

## 2022-04-08 DIAGNOSIS — D2372 Other benign neoplasm of skin of left lower limb, including hip: Secondary | ICD-10-CM | POA: Diagnosis not present

## 2022-04-08 DIAGNOSIS — L821 Other seborrheic keratosis: Secondary | ICD-10-CM | POA: Diagnosis not present

## 2022-04-08 DIAGNOSIS — L814 Other melanin hyperpigmentation: Secondary | ICD-10-CM | POA: Diagnosis not present

## 2022-04-11 DIAGNOSIS — Z01810 Encounter for preprocedural cardiovascular examination: Secondary | ICD-10-CM | POA: Diagnosis not present

## 2022-11-21 DIAGNOSIS — H04123 Dry eye syndrome of bilateral lacrimal glands: Secondary | ICD-10-CM | POA: Diagnosis not present

## 2022-11-21 DIAGNOSIS — E7800 Pure hypercholesterolemia, unspecified: Secondary | ICD-10-CM | POA: Diagnosis not present

## 2022-11-21 DIAGNOSIS — Z23 Encounter for immunization: Secondary | ICD-10-CM | POA: Diagnosis not present

## 2022-11-21 DIAGNOSIS — Z0000 Encounter for general adult medical examination without abnormal findings: Secondary | ICD-10-CM | POA: Diagnosis not present

## 2022-11-27 DIAGNOSIS — J019 Acute sinusitis, unspecified: Secondary | ICD-10-CM | POA: Diagnosis not present

## 2022-12-10 ENCOUNTER — Other Ambulatory Visit (HOSPITAL_COMMUNITY): Payer: Self-pay | Admitting: Family Medicine

## 2022-12-10 DIAGNOSIS — E7800 Pure hypercholesterolemia, unspecified: Secondary | ICD-10-CM

## 2022-12-19 DIAGNOSIS — B351 Tinea unguium: Secondary | ICD-10-CM | POA: Diagnosis not present

## 2022-12-19 DIAGNOSIS — L538 Other specified erythematous conditions: Secondary | ICD-10-CM | POA: Diagnosis not present

## 2022-12-19 DIAGNOSIS — L988 Other specified disorders of the skin and subcutaneous tissue: Secondary | ICD-10-CM | POA: Diagnosis not present

## 2022-12-19 DIAGNOSIS — L570 Actinic keratosis: Secondary | ICD-10-CM | POA: Diagnosis not present

## 2022-12-19 DIAGNOSIS — L820 Inflamed seborrheic keratosis: Secondary | ICD-10-CM | POA: Diagnosis not present

## 2022-12-20 ENCOUNTER — Encounter (HOSPITAL_COMMUNITY): Payer: Self-pay

## 2022-12-20 ENCOUNTER — Ambulatory Visit (HOSPITAL_COMMUNITY): Admission: RE | Admit: 2022-12-20 | Payer: 59 | Source: Ambulatory Visit

## 2022-12-27 ENCOUNTER — Ambulatory Visit (HOSPITAL_BASED_OUTPATIENT_CLINIC_OR_DEPARTMENT_OTHER)
Admission: RE | Admit: 2022-12-27 | Discharge: 2022-12-27 | Disposition: A | Discharge status: Home / Self Care | Payer: Self-pay | Source: Ambulatory Visit | Attending: Family Medicine | Admitting: Family Medicine

## 2022-12-27 DIAGNOSIS — E7800 Pure hypercholesterolemia, unspecified: Secondary | ICD-10-CM | POA: Insufficient documentation

## 2023-01-03 ENCOUNTER — Ambulatory Visit (HOSPITAL_COMMUNITY): Payer: Self-pay

## 2023-05-18 DIAGNOSIS — J029 Acute pharyngitis, unspecified: Secondary | ICD-10-CM | POA: Diagnosis not present

## 2023-05-18 DIAGNOSIS — J329 Chronic sinusitis, unspecified: Secondary | ICD-10-CM | POA: Diagnosis not present

## 2023-05-18 DIAGNOSIS — J40 Bronchitis, not specified as acute or chronic: Secondary | ICD-10-CM | POA: Diagnosis not present

## 2023-05-18 DIAGNOSIS — R0981 Nasal congestion: Secondary | ICD-10-CM | POA: Diagnosis not present

## 2023-10-17 DIAGNOSIS — L578 Other skin changes due to chronic exposure to nonionizing radiation: Secondary | ICD-10-CM | POA: Diagnosis not present

## 2023-10-17 DIAGNOSIS — L570 Actinic keratosis: Secondary | ICD-10-CM | POA: Diagnosis not present

## 2023-10-17 DIAGNOSIS — D2262 Melanocytic nevi of left upper limb, including shoulder: Secondary | ICD-10-CM | POA: Diagnosis not present

## 2023-10-17 DIAGNOSIS — R58 Hemorrhage, not elsewhere classified: Secondary | ICD-10-CM | POA: Diagnosis not present

## 2023-10-17 DIAGNOSIS — D225 Melanocytic nevi of trunk: Secondary | ICD-10-CM | POA: Diagnosis not present

## 2023-10-17 DIAGNOSIS — L820 Inflamed seborrheic keratosis: Secondary | ICD-10-CM | POA: Diagnosis not present

## 2023-10-17 DIAGNOSIS — D2261 Melanocytic nevi of right upper limb, including shoulder: Secondary | ICD-10-CM | POA: Diagnosis not present

## 2023-10-17 DIAGNOSIS — L538 Other specified erythematous conditions: Secondary | ICD-10-CM | POA: Diagnosis not present

## 2023-10-17 DIAGNOSIS — L814 Other melanin hyperpigmentation: Secondary | ICD-10-CM | POA: Diagnosis not present

## 2023-10-23 DIAGNOSIS — Z0000 Encounter for general adult medical examination without abnormal findings: Secondary | ICD-10-CM | POA: Diagnosis not present

## 2023-10-23 DIAGNOSIS — E7800 Pure hypercholesterolemia, unspecified: Secondary | ICD-10-CM | POA: Diagnosis not present
